# Patient Record
Sex: Female | Born: 2000 | Race: White | Hispanic: Yes | Marital: Single | Smoking: Never smoker
Health system: Southern US, Community
[De-identification: ages and names within clinical notes are randomized; demographics above are authoritative.]

## PROBLEM LIST (undated history)

## (undated) DIAGNOSIS — G250 Essential tremor: Secondary | ICD-10-CM

## (undated) DIAGNOSIS — G43909 Migraine, unspecified, not intractable, without status migrainosus: Secondary | ICD-10-CM

## (undated) DIAGNOSIS — S0300XA Dislocation of jaw, unspecified side, initial encounter: Secondary | ICD-10-CM

## (undated) DIAGNOSIS — F419 Anxiety disorder, unspecified: Secondary | ICD-10-CM

## (undated) DIAGNOSIS — F32A Depression, unspecified: Secondary | ICD-10-CM

## (undated) DIAGNOSIS — D689 Coagulation defect, unspecified: Secondary | ICD-10-CM

## (undated) DIAGNOSIS — L709 Acne, unspecified: Secondary | ICD-10-CM

## (undated) HISTORY — DX: Essential tremor: G25.0

## (undated) HISTORY — PX: MOUTH SURGERY: SHX715

## (undated) HISTORY — DX: Anxiety disorder, unspecified: F41.9

## (undated) HISTORY — DX: Depression, unspecified: F32.A

## (undated) HISTORY — DX: Dislocation of jaw, unspecified side, initial encounter: S03.00XA

## (undated) HISTORY — DX: Coagulation defect, unspecified: D68.9

---
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
## 2022-01-20 IMAGING — CT CT ABD-PELV W/ CM
2 of 4 series · 16 of 46 positions shown, 18 images · IV contrast (APPLIED)
Comparison: 01/03/2021

CLINICAL DATA: Upper epigastric pain and nausea for 1 day.

EXAM:
CT ABDOMEN AND PELVIS WITH CONTRAST
TECHNIQUE: Multidetector CT imaging of the abdomen and pelvis was performed
using the standard protocol following bolus administration of
intravenous contrast.
CONTRAST:  75mL OMNIPAQUE IOHEXOL 300 MG/ML  SOLN

[Series 2: routine abd/pel with · axial · 0.72mm/px · z∈[-938,-518]mm · 13 of 92 slices shown, 15 images]
[im 4/92  soft-tissue]
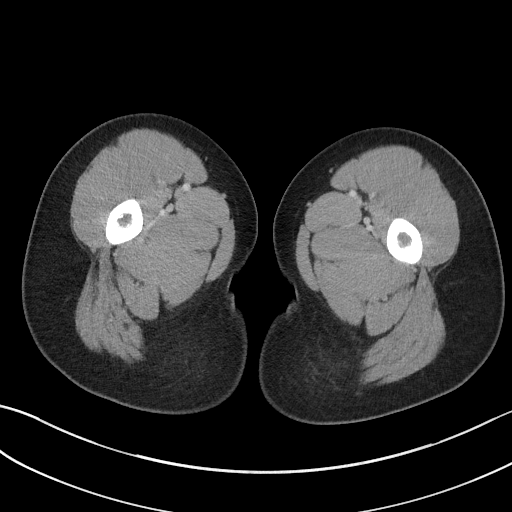
[im 4/92  bone]
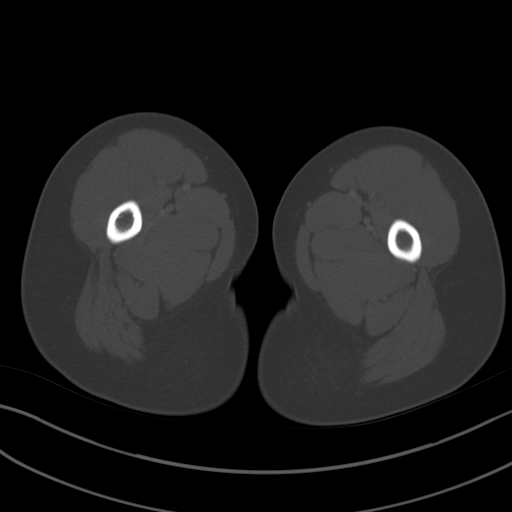
[im 11/92  soft-tissue]
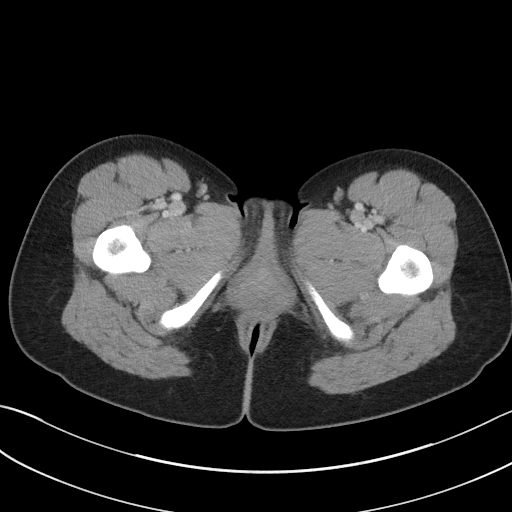
[im 19/92  soft-tissue]
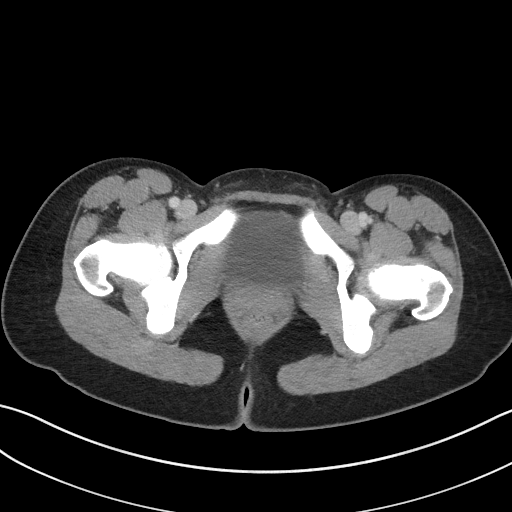
[im 26/92  soft-tissue]
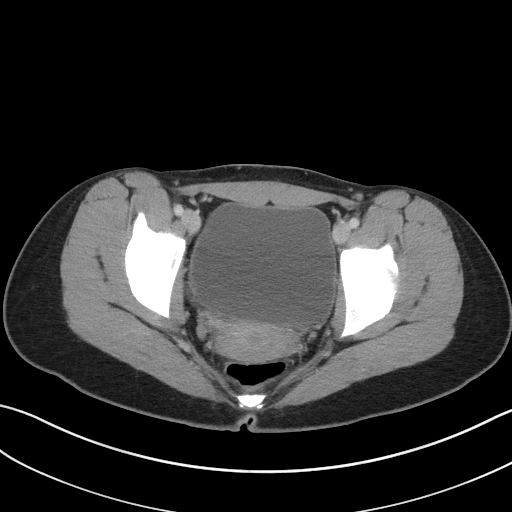
[im 33/92  soft-tissue]
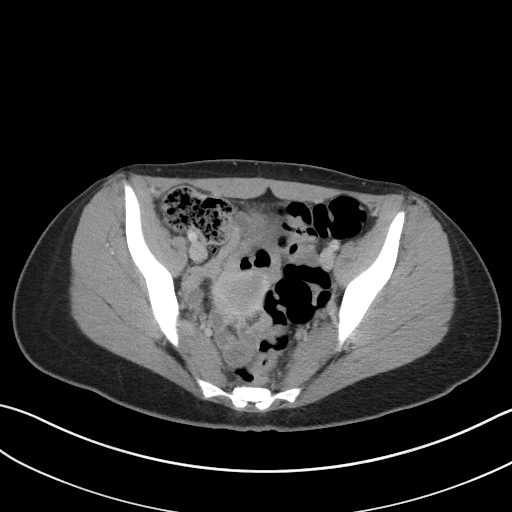
[im 41/92  soft-tissue]
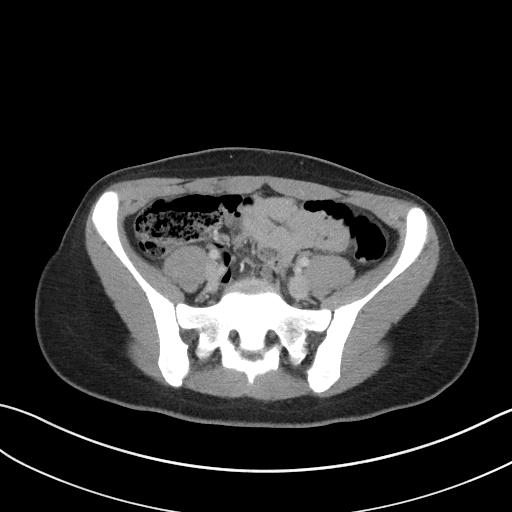
[im 48/92  soft-tissue]
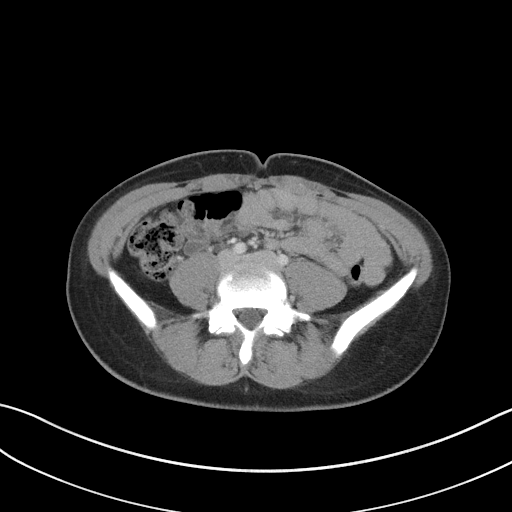
[im 51/92  soft-tissue]
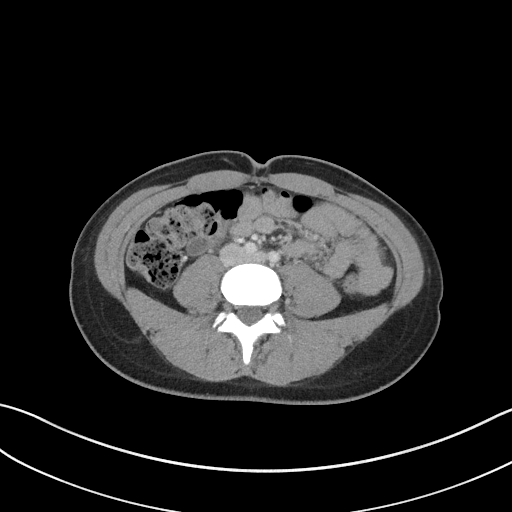
[im 59/92  soft-tissue]
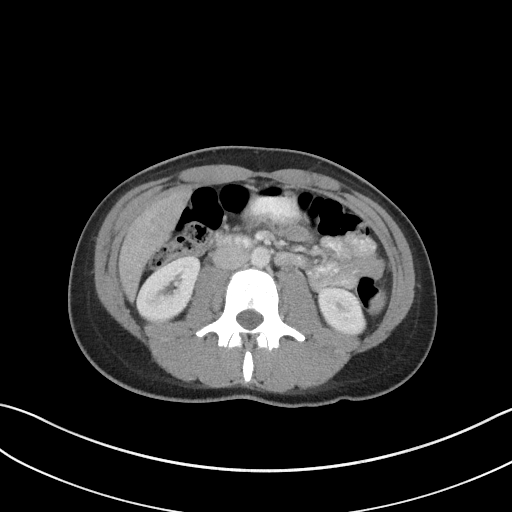
[im 59/92  bone]
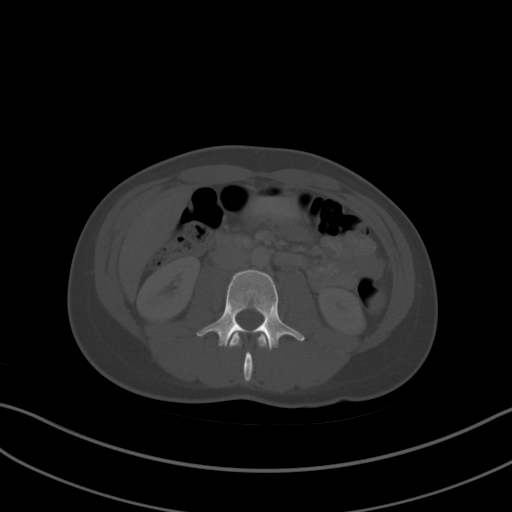
[im 66/92  soft-tissue]
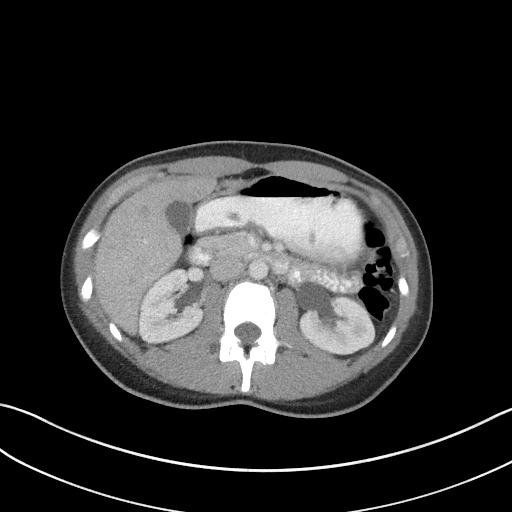
[im 73/92  soft-tissue]
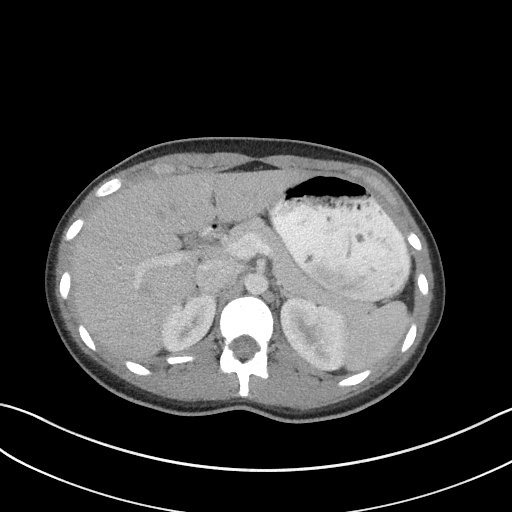
[im 81/92  soft-tissue]
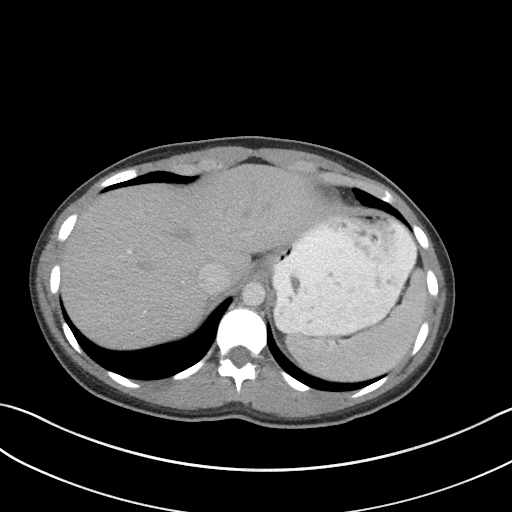
[im 88/92  soft-tissue]
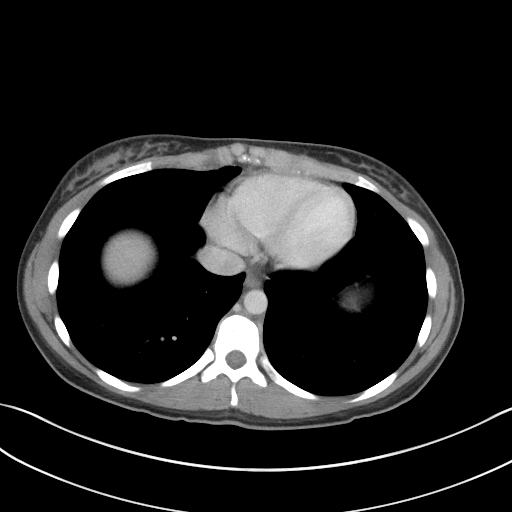

[Series 5: coronal st · coronal · 0.73mm/px · 3 of 79 slices shown]
[im 27/79  soft-tissue]
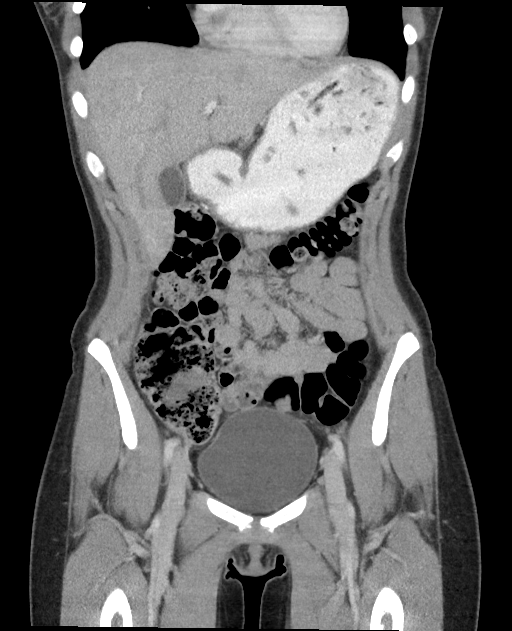
[im 35/79  soft-tissue]
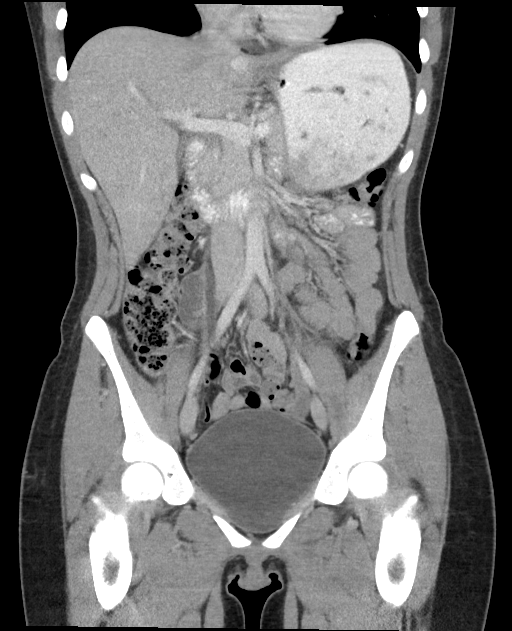
[im 44/79  soft-tissue]
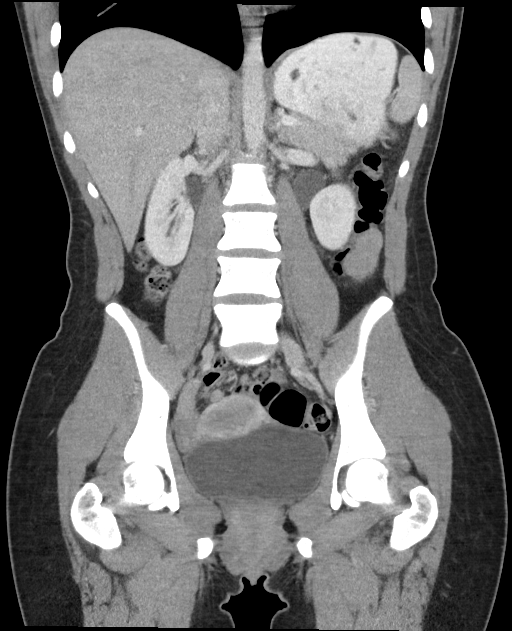

[16 of 46 positions shown; findings below may reference images not displayed]

FINDINGS: Lower chest: Lung bases are clear.

Hepatobiliary: No focal liver abnormality is seen. No gallstones,
gallbladder wall thickening, or biliary dilatation.

Pancreas: Unremarkable. No pancreatic ductal dilatation or
surrounding inflammatory changes.

Spleen: Normal in size without focal abnormality.

Adrenals/Urinary Tract: Adrenal glands are unremarkable. Kidneys are
normal, without renal calculi, focal lesion, or hydronephrosis.
Bladder is unremarkable.

Stomach/Bowel: Stomach is within normal limits. Appendix appears
normal. No evidence of bowel wall thickening, distention, or
inflammatory changes.

Vascular/Lymphatic: No significant vascular findings are present. No
enlarged abdominal or pelvic lymph nodes.

Reproductive: Uterus and bilateral adnexa are unremarkable.

Other: No abdominal wall hernia or abnormality. No abdominopelvic
ascites.

Musculoskeletal: No acute or significant osseous findings.
IMPRESSION: No acute abnormalities demonstrated in the abdomen or pelvis. No
evidence of bowel obstruction or inflammation.

## 2022-03-27 ENCOUNTER — Encounter: Payer: Self-pay | Admitting: Internal Medicine

## 2022-03-30 ENCOUNTER — Encounter: Payer: Self-pay | Admitting: Internal Medicine

## 2022-03-30 ENCOUNTER — Ambulatory Visit: Payer: No Typology Code available for payment source | Admitting: Internal Medicine

## 2022-03-30 VITALS — BP 102/68 | HR 94 | Ht 65.0 in | Wt 130.0 lb

## 2022-03-30 DIAGNOSIS — D681 Hereditary factor XI deficiency: Secondary | ICD-10-CM

## 2022-03-30 DIAGNOSIS — R519 Headache, unspecified: Secondary | ICD-10-CM | POA: Diagnosis not present

## 2022-03-30 DIAGNOSIS — F3289 Other specified depressive episodes: Secondary | ICD-10-CM | POA: Diagnosis not present

## 2022-03-30 NOTE — Progress Notes (Signed)
Date:  03/30/2022   Name:  Allison Riddle   DOB:  Sep 09, 2000   MRN:  [id number]   Chief Complaint: Form Completion  Migraine  This is a recurrent (followed by specialist) problem. The problem has been gradually improving. The pain quality is similar to prior headaches. Pertinent negatives include no abdominal pain, coughing, dizziness or weakness. Treatments tried: gabapentin, clonazepam, Qulipta, Amovig and Axert. The treatment provided moderate relief.  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  If she does have bleeding, she takes Lysteda and she has Amicar for other bleeding. She has to participate in a regular gym class this fall and needs clearance.  She exercises regularly without any problems.  Lab Results  Component Value Date   NA 139 02/07/2021   K 4.3 02/07/2021   CO2 27 02/07/2021   GLUCOSE 122 (H) 02/07/2021   BUN 15 02/07/2021   CREATININE 0.85 02/07/2021   CALCIUM 9.3 02/07/2021   GFRNONAA >60 02/07/2021   No results found for: "CHOL", "HDL", "LDLCALC", "LDLDIRECT", "TRIG", "CHOLHDL" No results found for: "TSH" No results found for: "HGBA1C" Lab Results  Component Value Date   WBC 6.3 02/07/2021   HGB 11.8 (L) 02/07/2021   HCT 32.5 (L) 02/07/2021   MCV 93.9 02/07/2021   PLT 275 02/07/2021   Lab Results  Component Value Date   ALT 12 02/07/2021   AST 15 02/07/2021   ALKPHOS 35 (L) 02/07/2021   BILITOT 0.6 02/07/2021   No results found for: "25OHVITD2", "25OHVITD3", "VD25OH"   Review of Systems  Constitutional:  Negative for fatigue and unexpected weight change.  HENT:  Negative for nosebleeds.   Eyes:  Negative for visual disturbance.  Respiratory:  Negative for cough, chest tightness, shortness of breath and wheezing.   Cardiovascular:  Negative for chest pain, palpitations and leg swelling.  Gastrointestinal:  Negative for abdominal pain, constipation and diarrhea.  Genitourinary:  Negative for  menstrual problem.  Neurological:  Negative for dizziness, weakness, light-headedness and headaches.  Hematological:  Bruises/bleeds easily.  Psychiatric/Behavioral:  Positive for sleep disturbance. Negative for suicidal ideas.     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 NSAIDS contraindicated with bleeding disorder.    Penicillins Hives   Red Dye Other (See Comments) and Nausea And Vomiting    Had some type of reaction is red dye 40 Had some type of reaction is red dye 40 Had some type of reaction is red dye 40 Had some type of reaction is red dye 40     Past Surgical History:  Procedure Laterality Date   MOUTH SURGERY      Social History   Tobacco Use   Smoking status: Never   Smokeless tobacco: Never  Vaping Use   Vaping Use: Never used  Substance Use Topics   Alcohol use: Yes    Comment: occasional  Drug use: Never     Medication list has been reviewed and updated.  Current Meds  Medication Sig   AIMOVIG 140 MG/ML SOAJ Inject into the skin.   almotriptan (AXERT) 12.5 MG tablet Take by mouth.   aminocaproic acid (AMICAR) 500 MG tablet Take 500 mg by mouth every 6 (six) hours. Hematology   atenolol (TENORMIN) 25 MG tablet Take 25 mg by mouth daily.   cetirizine (ZYRTEC) 10 MG tablet Take 1 tablet (10 mg total) by mouth daily.   clonazePAM (KLONOPIN) 1 MG tablet Take 1 mg by  mouth 2 (two) times daily as needed.   cyclobenzaprine (FLEXERIL) 10 MG tablet Take 10 mg by mouth at bedtime.   cyclobenzaprine (FLEXERIL) 5 MG tablet Take 1 tablet 2 ours before bedtime   dicyclomine (BENTYL) 20 MG tablet Take 1 tablet (20 mg total) by mouth every 8 (eight) hours as needed for spasms (Abdominal cramping).   etonogestrel (NEXPLANON) 68 MG IMPL implant 1 each by Subdermal route once.   fluticasone (FLONASE) 50 MCG/ACT nasal spray Place 2 sprays into both nostrils daily.   gabapentin (NEURONTIN) 100 MG capsule Take by mouth.   mupirocin cream (BACTROBAN) 2 % Apply 1 application topically 2 (two) times daily.   Norgestimate-Ethinyl Estradiol Triphasic 0.18/0.215/0.25 MG-35 MCG tablet Take by mouth.   ondansetron (ZOFRAN ODT) 4 MG disintegrating tablet Take 1 tablet (4 mg total) by mouth every 6 (six) hours as needed for nausea or vomiting.   QULIPTA 60 MG TABS Take 1 tablet by mouth daily.   tranexamic acid (LYSTEDA) 650 MG TABS tablet Take 2 tablets by mouth 3 (three) times daily as needed.   [DISCONTINUED] promethazine (PHENERGAN) 12.5 MG tablet Take 1-2 tablets by mouth daily as needed.   [DISCONTINUED] Rimegepant Sulfate (NURTEC) 75 MG TBDP Take by mouth as needed. neurology       03/30/2022    2:50 PM 06/26/2021    4:16 PM 10/03/2020    1:53 PM  GAD 7 : Generalized Anxiety Score  Nervous, Anxious, on Edge 3 3 3   Control/stop worrying 3 3 3   Worry too much - different things 3 3 3   Trouble relaxing 3 2 3   Restless 3 2 3   Easily annoyed or irritable 3 2 2   Afraid - awful might happen 3 2 2   Total GAD 7 Score 21 17 19   Anxiety Difficulty Somewhat difficult Somewhat difficult Somewhat difficult       03/30/2022    2:49 PM 06/26/2021    4:16 PM 10/03/2020    2:08 PM  Depression screen PHQ 2/9  Decreased Interest 2 1 2   Down, Depressed, Hopeless 3 3 2   PHQ - 2 Score 5 4 4   Altered sleeping 3 3 3   Tired, decreased energy 3 2 3   Change in appetite 3 3 2   Feeling  bad or failure about yourself  3 2 1   Trouble concentrating 3 1 2   Moving slowly or fidgety/restless 2 1 1   Suicidal thoughts 0 0 0  PHQ-9 Score 22 16 16   Difficult doing work/chores Somewhat difficult Somewhat difficult Somewhat difficult    BP Readings from Last 3 Encounters:  03/30/22 102/68  06/26/21 102/68  02/08/21 119/78    Physical Exam Vitals and nursing note reviewed.  Constitutional:      General: She is not in acute distress.    Appearance: Normal appearance. She is well-developed.  HENT:     Head: Normocephalic and atraumatic.  Cardiovascular:  Rate and Rhythm: Normal rate and regular rhythm.  Pulmonary:     Effort: Pulmonary effort is normal. No respiratory distress.     Breath sounds: No wheezing or rhonchi.  Musculoskeletal:     Cervical back: Normal range of motion.     Right lower leg: No edema.     Left lower leg: No edema.  Lymphadenopathy:     Cervical: No cervical adenopathy.  Skin:    General: Skin is warm and dry.     Capillary Refill: Capillary refill takes less than 2 seconds.     Findings: No rash.  Neurological:     General: No focal deficit present.     Mental Status: She is alert and oriented to person, place, and time.  Psychiatric:        Mood and Affect: Mood normal.        Behavior: Behavior normal.     Wt Readings from Last 3 Encounters:  03/30/22 130 lb (59 kg) (54 %, Z= 0.10)*  06/26/21 126 lb (57.2 kg) (50 %, Z= 0.00)*  02/07/21 130 lb (59 kg) (59 %, Z= 0.23)*   * Growth percentiles are based on CDC (Girls, 2-20 Years) data.    BP 102/68   Pulse 94   Ht 5\' 5"  (1.651 m)   Wt 130 lb (59 kg)   LMP  (LMP Unknown) Comment: no periods  SpO2 97%   BMI 21.63 kg/m   Assessment and Plan: 1. Factor XI deficiency (HCC) Form completed to clear her for PE class.  2. Headache disorder Much improved with multi-modal treatment by a specialist.  3. Other depression Seems to be doing well at this time without  medication.   Partially dictated using . Any errors are unintentional.  Animal nutritionist, MD [facility]  03/30/2022

## 2022-04-17 DIAGNOSIS — G43909 Migraine, unspecified, not intractable, without status migrainosus: Secondary | ICD-10-CM | POA: Insufficient documentation

## 2022-09-10 ENCOUNTER — Encounter: Payer: No Typology Code available for payment source | Admitting: Internal Medicine

## 2022-09-24 ENCOUNTER — Telehealth: Payer: Self-pay

## 2022-09-24 NOTE — Transitions of Care (Post Inpatient/ED Visit) (Signed)
   09/24/2022  Name: Allison Riddle MRN: [id number] DOB: 2001-01-15  Today's TOC FU Call Status: Today's TOC FU Call Status:: Unsuccessful Call (2nd Attempt) Unsuccessful Call (2nd Attempt) Date: 09/24/22  Attempted to reach the patient regarding the most recent Inpatient/ED visit.  Follow Up Plan: Additional outreach attempts will be made to reach the patient to complete the Transitions of Care (Post Inpatient/ED visit) call.   [place]  Primary Care & Sports Medicine at [facility], [place] [place]  [place] [address] Office [phone]  Fax: [phone]

## 2022-09-24 NOTE — Transitions of Care (Post Inpatient/ED Visit) (Signed)
   09/24/2022  Name: Allison Riddle MRN: [id number] DOB: 02-11-2001  Today's TOC FU Call Status: Today's TOC FU Call Status:: Unsuccessful Call (3rd Attempt) Unsuccessful Call (3rd Attempt) Date: 09/24/22  Attempted to reach the patient regarding the most recent Inpatient/ED visit.  Follow Up Plan: No further outreach attempts will be made at this time. We have been unable to contact the patient.  [place]  Primary Care & Sports Medicine at [facility], [place]  [place] [address] Office [phone]  Fax: [phone]

## 2022-09-24 NOTE — Transitions of Care (Post Inpatient/ED Visit) (Signed)
   09/24/2022  Name: Allison Riddle MRN: [id number] DOB: Feb 17, 2001  Today's TOC FU Call Status:    Attempted to reach the patient regarding the most recent Inpatient/ED visit.  Follow Up Plan: Additional outreach attempts will be made to reach the patient to complete the Transitions of Care (Post Inpatient/ED visit) call.   [place]  Primary Care & Sports Medicine at [facility], [place]  [place] [address] Office [phone]  Fax: [phone]

## 2022-09-25 ENCOUNTER — Telehealth: Payer: Self-pay

## 2022-09-25 NOTE — Transitions of Care (Post Inpatient/ED Visit) (Signed)
   09/25/2022  Name: Allison Riddle MRN: [id number] DOB: 09-20-2000  Today's TOC FU Call Status: Today's TOC FU Call Status:: Successful TOC FU Call Competed  Transition Care Management Follow-up Telephone Call Date of Discharge: 09/21/22 Discharge Facility: Other ([facility]) Name of Other ([facility]) Discharge Facility: [place] Type of Discharge: Emergency Department Reason for ED Visit: Neurologic (Numbness and Tingling on Left Side of body/ Migraine) How have you been since you were released from the hospital?: Same Any questions or concerns?: Yes Patient Questions/Concerns:: Is this related to migraines?  Items Reviewed: Did you receive and understand the discharge instructions provided?: Yes Medications obtained and verified?: No Medications Not Reviewed Reasons:: Advised Patient to Call Provider Office Any new allergies since your discharge?: Yes (Ajovy- Passed out/ Face Swelling and Itching) Dietary orders reviewed?: No Do you have support at home?: Yes People in Home: significant other  Home Care and Equipment/Supplies: [employer] Ordered?: No Any new equipment or medical supplies ordered?: No  Functional Questionnaire: Do you need assistance with bathing/showering or dressing?: No Do you need assistance with meal preparation?: No Do you need assistance with eating?: No Do you have difficulty maintaining continence: No Do you need assistance with getting out of bed/getting out of a chair/moving?: No Do you have difficulty managing or taking your medications?: No  Folllow up appointments reviewed: PCP Follow-up appointment confirmed?: Yes Follow-up Provider: Halina Maidens, MD Specialist Hospital Follow-up appointment confirmed?: No Do you understand care options if your condition(s) worsen?: Yes-patient verbalized understanding   [facility] [place], [place]  [place]  [place] [address] Office [phone]  Fax: [phone]

## 2022-10-09 ENCOUNTER — Ambulatory Visit: Payer: No Typology Code available for payment source | Admitting: Internal Medicine

## 2022-12-20 ENCOUNTER — Encounter: Payer: Self-pay | Admitting: Internal Medicine

## 2022-12-20 ENCOUNTER — Encounter: Payer: No Typology Code available for payment source | Admitting: Internal Medicine

## 2023-02-14 ENCOUNTER — Encounter: Payer: No Typology Code available for payment source | Admitting: Internal Medicine

## 2023-03-04 ENCOUNTER — Telehealth: Payer: Self-pay

## 2023-03-04 NOTE — Transitions of Care (Post Inpatient/ED Visit) (Signed)
   03/04/2023  Name: Allison Riddle MRN: [id number] DOB: 2000/10/04  Today's TOC FU Call Status: Today's TOC FU Call Status:: Unsuccessul Call (1st Attempt) Unsuccessful Call (1st Attempt) Date: 03/04/23  Attempted to reach the patient regarding the most recent Inpatient/ED visit.  Follow Up Plan: Additional outreach attempts will be made to reach the patient to complete the Transitions of Care (Post Inpatient/ED visit) call.   Liv Rallis [facility]  Primary Care & Sports Medicine at [facility], [facility] [address]  [place] [address] Office [phone]  Fax: [phone]

## 2023-03-07 ENCOUNTER — Encounter: Payer: No Typology Code available for payment source | Admitting: Internal Medicine

## 2023-03-11 NOTE — Transitions of Care (Post Inpatient/ED Visit) (Unsigned)
   03/11/2023  Name: Allison Riddle MRN: [id number] DOB: 05/09/2001  Today's TOC FU Call Status: Today's TOC FU Call Status:: Unsuccessful Call (2nd Attempt) Unsuccessful Call (1st Attempt) Date: 03/04/23 Unsuccessful Call (2nd Attempt) Date: 03/11/23  Attempted to reach the patient regarding the most recent Inpatient/ED visit.  Follow Up Plan: Additional outreach attempts will be made to reach the patient to complete the Transitions of Care (Post Inpatient/ED visit) call.   Kaleth Koy [facility]  Primary Care & Sports Medicine at [facility], [facility] [address]  [place] [address] Office [phone]  Fax: [phone]

## 2023-03-13 NOTE — Transitions of Care (Post Inpatient/ED Visit) (Signed)
   03/13/2023  Name: Travia Savoy MRN: [id number] DOB: 10-Feb-2001  Today's TOC FU Call Status: Today's TOC FU Call Status:: Unsuccessful Call (3rd Attempt) Unsuccessful Call (1st Attempt) Date: 03/04/23 Unsuccessful Call (2nd Attempt) Date: 03/11/23 Unsuccessful Call (3rd Attempt) Date: 03/13/23  Attempted to reach the patient regarding the most recent Inpatient/ED visit.  Follow Up Plan: No further outreach attempts will be made at this time. We have been unable to contact the patient.  Talin Rozeboom [facility]  Primary Care & Sports Medicine at [facility], [facility] [address]  [place] [address] Office [phone]  Fax: [phone]

## 2023-10-30 ENCOUNTER — Encounter: Payer: Self-pay | Admitting: Internal Medicine

## 2024-03-09 ENCOUNTER — Telehealth: Payer: Self-pay

## 2024-03-09 NOTE — Transitions of Care (Post Inpatient/ED Visit) (Signed)
 03/09/2024  Name: Allison Riddle MRN: [id number] DOB: [birth year]  Today's TOC FU Call Status:   Patient's Name and Date of Birth confirmed.  Transition Care Management Follow-up Telephone Call Date of Discharge: 03/08/24 Discharge Facility: Other ([facility]) Name of Other ([facility]) Discharge Facility: [facility] Type of Discharge: Emergency Department Reason for ED Visit: Other: How have you been since you were released from the hospital?: Same Any questions or concerns?: No  Items Reviewed: Did you receive and understand the discharge instructions provided?: No Medications obtained,verified, and reconciled?: Yes (Medications Reviewed) Any new allergies since your discharge?: No Dietary orders reviewed?: NA Do you have support at home?: Yes People in Home [RPT]: parent(s)  Medications Reviewed Today: Medications Reviewed Today     Reviewed by Avarose Mervine N, CMA (Certified Medical Assistant) on 03/09/24 at 1332  Med List Status: <None>   Medication Order Taking? Sig Documenting Provider Last Dose Status Informant  AIMOVIG 140 MG/ML SOAJ [id number] Yes Inject into the skin. [provider]  Active   almotriptan (AXERT) 12.5 MG tablet [id number] Yes Take by mouth. [provider]  Active   aminocaproic acid (AMICAR) 500 MG tablet [id number] Yes Take 500 mg by mouth every 6 (six) hours. Hematology [provider]  Active   cetirizine  (ZYRTEC ) 10 MG tablet [id number] Yes Take 1 tablet (10 mg total) by mouth daily. Matthews, Jason J, MD  Active   clonazePAM (KLONOPIN) 1 MG tablet [id number] Yes Take 1 mg by mouth 2 (two) times daily as needed. [provider]  Active            Med Note Allison Riddle   Thu Oct 12, 2021 10:26 AM) From psych  etonogestrel (NEXPLANON) 68 MG IMPL implant [id number] Yes 1 each by Subdermal route once. [provider]  Active Self  fluticasone  (FLONASE ) 50 MCG/ACT nasal spray [id number] Yes Place 2 sprays  into both nostrils daily. Matthews, Jason J, MD  Active   gabapentin (NEURONTIN) 100 MG capsule [id number] Yes Take by mouth. [provider]  Active   mupirocin  cream (BACTROBAN ) 2 % [id number] Yes Apply 1 application topically 2 (two) times daily. Justus [name], MD  Active     Discontinued 09/01/19 1612   Norgestimate-Ethinyl Estradiol Triphasic 0.18/0.215/0.25 MG-35 MCG tablet [id number] Yes Take by mouth. [provider]  Active      Discontinued 08/20/20 1444   ondansetron  (ZOFRAN  ODT) 4 MG disintegrating tablet [id number] Yes Take 1 tablet (4 mg total) by mouth every 6 (six) hours as needed for nausea or vomiting. Ward, Josette SAILOR, DO  Active   QULIPTA 60 MG TABS [id number] Yes Take 1 tablet by mouth daily. [provider]  Active   tranexamic acid (LYSTEDA) 650 MG TABS tablet [id number] Yes Take 2 tablets by mouth 3 (three) times daily as needed. [provider]  Active             Home Care and Equipment/Supplies: Were Home Health Services Ordered?: NA Any new equipment or medical supplies ordered?: NA  Functional Questionnaire: Do you need assistance with bathing/showering or dressing?: No Do you need assistance with meal preparation?: No Do you need assistance with eating?: No Do you have difficulty maintaining continence: No Do you need assistance with getting out of bed/getting out of a chair/moving?: No Do you have difficulty managing or taking your medications?: No  Follow up appointments reviewed: PCP Follow-up appointment confirmed?: NA Specialist Hospital Follow-up appointment confirmed?: Yes Date of Specialist follow-up appointment?:  03/16/24 Do you need transportation to your follow-up appointment?: No Do you understand care options if your condition(s) worsen?: Yes-patient verbalized understanding   [place] Pawling [place]  Primary Care & Sports Medicine at [facility], [facility] [address]   [place] [address] Office [phone]  Fax: [phone]
